# Patient Record
Sex: Male | Born: 1980 | Race: Black or African American | Hispanic: No | Marital: Single | Smoking: Current every day smoker
Health system: Southern US, Community
[De-identification: ages and names within clinical notes are randomized; demographics above are authoritative.]

---
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
## 2003-05-24 ENCOUNTER — Encounter: Payer: Self-pay | Admitting: Emergency Medicine

## 2003-05-24 ENCOUNTER — Emergency Department (HOSPITAL_COMMUNITY): Admission: EM | Admit: 2003-05-24 | Discharge: 2003-05-25 | Payer: Self-pay | Admitting: Emergency Medicine

## 2008-02-06 ENCOUNTER — Emergency Department (HOSPITAL_COMMUNITY): Admission: EM | Admit: 2008-02-06 | Discharge: 2008-02-06 | Payer: Self-pay | Admitting: Emergency Medicine

## 2011-07-07 ENCOUNTER — Other Ambulatory Visit (HOSPITAL_COMMUNITY): Payer: Self-pay | Admitting: Internal Medicine

## 2011-07-07 ENCOUNTER — Ambulatory Visit (HOSPITAL_COMMUNITY)
Admission: RE | Admit: 2011-07-07 | Discharge: 2011-07-07 | Disposition: A | Discharge status: Home / Self Care | Payer: Self-pay | Source: Ambulatory Visit | Attending: Internal Medicine | Admitting: Internal Medicine

## 2011-07-07 DIAGNOSIS — R109 Unspecified abdominal pain: Secondary | ICD-10-CM | POA: Insufficient documentation

## 2012-05-23 IMAGING — CR DG ABDOMEN 1V
1 series · 1 of 1 positions shown · non-contrast
Comparison: None.

CLINICAL DATA: Abdominal pain for 2 weeks

ABDOMEN - 1 VIEW

[t abdomen supine]
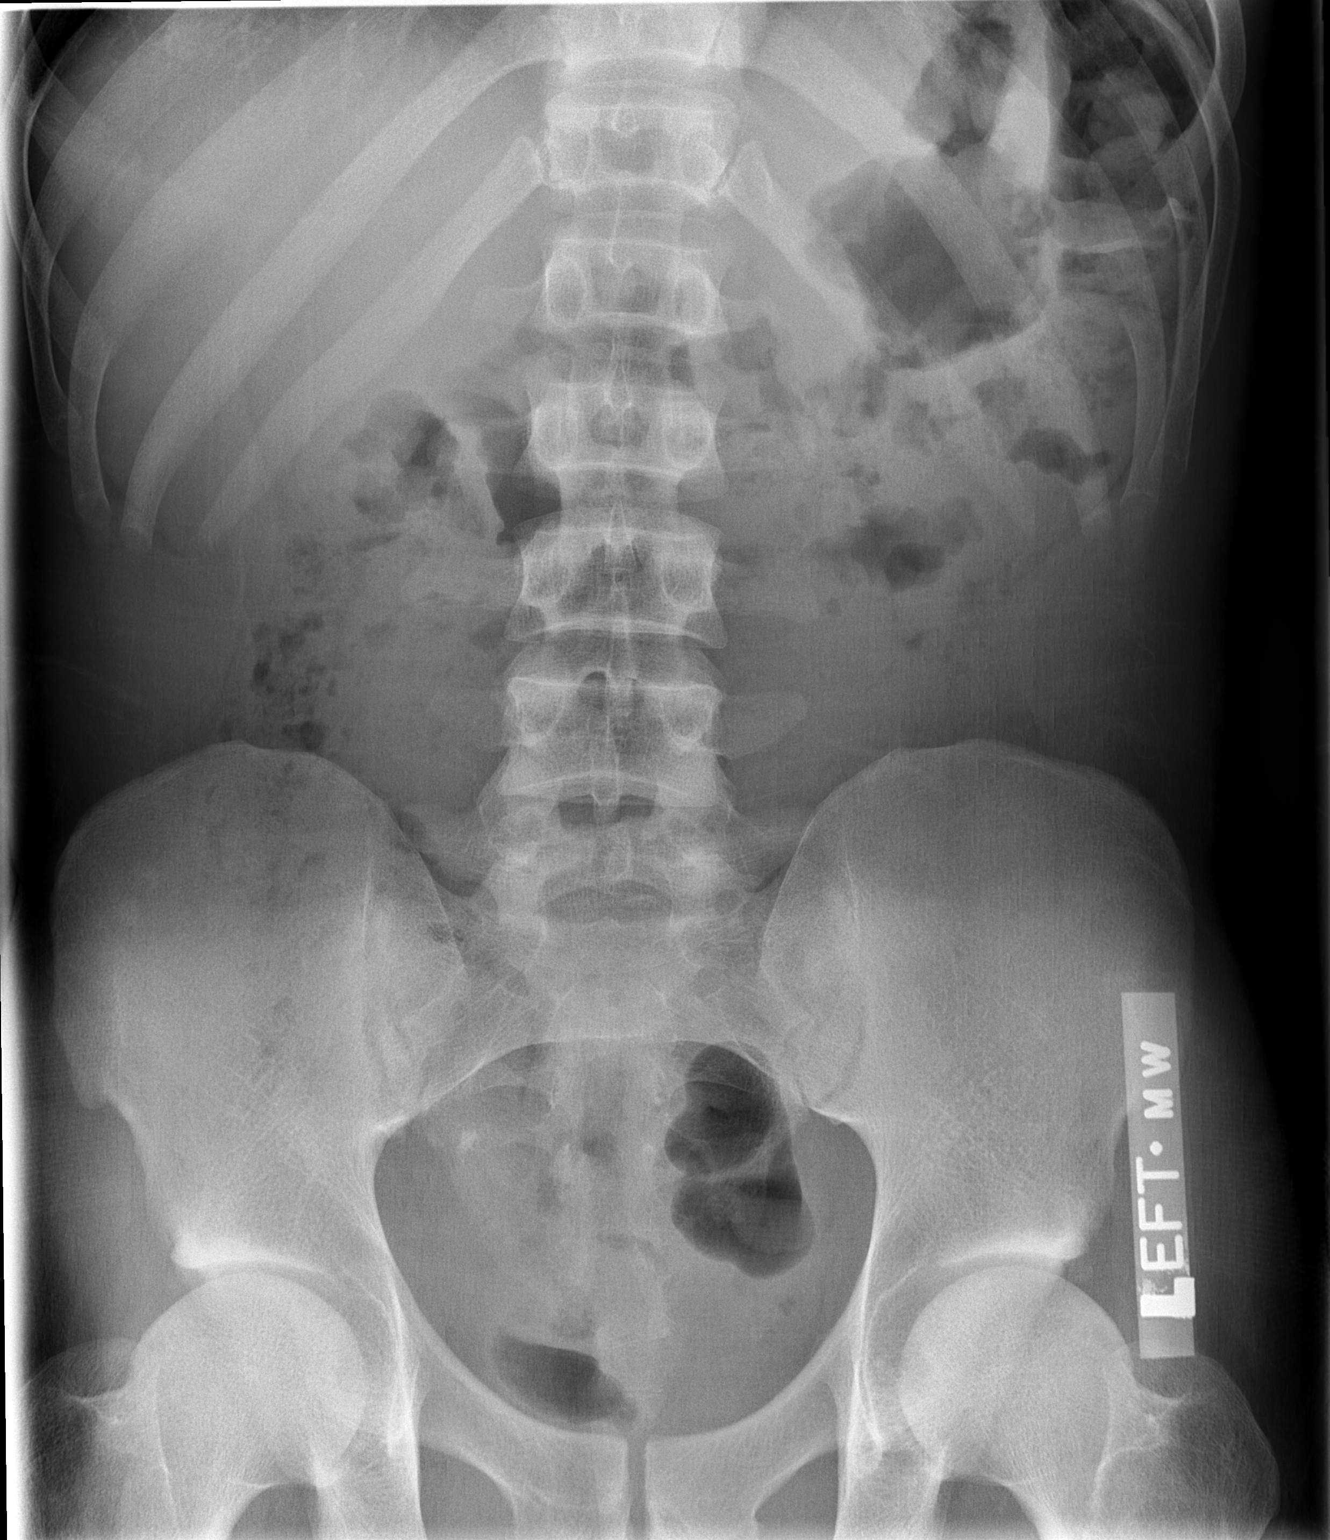

[1 of 1 positions shown; findings below may reference images not displayed]

FINDINGS: The bowel gas pattern is normal.  No significant stool
burden.  No abnormal calcification or evidence of organomegaly.
The bones are unremarkable.
IMPRESSION: Normal bowel gas pattern.

## 2014-02-01 ENCOUNTER — Emergency Department (HOSPITAL_COMMUNITY): Payer: Self-pay

## 2014-02-01 ENCOUNTER — Emergency Department (HOSPITAL_COMMUNITY)
Admission: EM | Admit: 2014-02-01 | Discharge: 2014-02-01 | Disposition: A | Discharge status: Home / Self Care | Payer: Self-pay | Attending: Emergency Medicine | Admitting: Emergency Medicine

## 2014-02-01 ENCOUNTER — Encounter (HOSPITAL_COMMUNITY): Payer: Self-pay | Admitting: Emergency Medicine

## 2014-02-01 DIAGNOSIS — F17200 Nicotine dependence, unspecified, uncomplicated: Secondary | ICD-10-CM | POA: Insufficient documentation

## 2014-02-01 DIAGNOSIS — N453 Epididymo-orchitis: Secondary | ICD-10-CM | POA: Insufficient documentation

## 2014-02-01 DIAGNOSIS — R110 Nausea: Secondary | ICD-10-CM | POA: Insufficient documentation

## 2014-02-01 DIAGNOSIS — N451 Epididymitis: Secondary | ICD-10-CM

## 2014-02-01 DIAGNOSIS — Z880 Allergy status to penicillin: Secondary | ICD-10-CM | POA: Insufficient documentation

## 2014-02-01 LAB — URINALYSIS, ROUTINE W REFLEX MICROSCOPIC
Bilirubin Urine: NEGATIVE
GLUCOSE, UA: NEGATIVE mg/dL
Ketones, ur: NEGATIVE mg/dL
Nitrite: NEGATIVE
PROTEIN: NEGATIVE mg/dL
Specific Gravity, Urine: 1.02 (ref 1.005–1.030)
UROBILINOGEN UA: 1 mg/dL (ref 0.0–1.0)
pH: 6.5 (ref 5.0–8.0)

## 2014-02-01 LAB — URINE MICROSCOPIC-ADD ON

## 2014-02-01 MED ORDER — CEFTRIAXONE SODIUM 250 MG IJ SOLR
250.0000 mg | INTRAMUSCULAR | Status: DC
  Administered 2014-02-01: 250 mg via INTRAMUSCULAR
  Filled 2014-02-01: qty 250

## 2014-02-01 MED ORDER — OXYCODONE-ACETAMINOPHEN 5-325 MG PO TABS: 1.0000 | ORAL_TABLET | Freq: Four times a day (QID) | ORAL | Status: DC | PRN

## 2014-02-01 MED ORDER — LIDOCAINE HCL 1 % IJ SOLN
0.9000 mL | Freq: Once | INTRAMUSCULAR | Status: AC
  Administered 2014-02-01: 0.9 mL

## 2014-02-01 MED ORDER — LIDOCAINE HCL 1 % IJ SOLN
INTRAMUSCULAR | Status: AC
  Filled 2014-02-01: qty 20

## 2014-02-01 MED ORDER — PROMETHAZINE HCL 25 MG PO TABS: 25.0000 mg | ORAL_TABLET | Freq: Four times a day (QID) | ORAL | Status: DC | PRN

## 2014-02-01 MED ORDER — AZITHROMYCIN 250 MG PO TABS
1000.0000 mg | ORAL_TABLET | Freq: Once | ORAL | Status: AC
  Administered 2014-02-01: 1000 mg via ORAL
  Filled 2014-02-01: qty 4

## 2014-02-01 MED ORDER — OXYCODONE-ACETAMINOPHEN 5-325 MG PO TABS
2.0000 | ORAL_TABLET | Freq: Once | ORAL | Status: AC
  Administered 2014-02-01: 2 via ORAL
  Filled 2014-02-01: qty 2

## 2014-02-01 MED ORDER — ONDANSETRON 8 MG PO TBDP
8.0000 mg | ORAL_TABLET | Freq: Once | ORAL | Status: AC
  Administered 2014-02-01: 8 mg via ORAL
  Filled 2014-02-01: qty 1

## 2014-02-01 MED ORDER — DOXYCYCLINE HYCLATE 100 MG PO CAPS: 100.0000 mg | ORAL_CAPSULE | Freq: Two times a day (BID) | ORAL | Status: DC

## 2014-02-01 NOTE — ED Notes (Signed)
Pt c/o lower right abd and nausea x 1 day.

## 2014-02-01 NOTE — Progress Notes (Signed)
[facility] provided pt with a list of primary care resources and a [facility] [employer] Card application to help patient establish primary care.  °

## 2014-02-01 NOTE — ED Notes (Signed)
Initial Contact - pt to [facility]17 with family, reports c/o 7/10 RLQ abd pain x2 days, also mild nausea.  Pt also reports pain occasionally in R testicle, denies redness or swelling to site.  Pt reports pain worse with movement.  Denies dysuria, fevers/chills, or other complaints.  Skin PWD.  A+Ox4.  Ambulatory with steady gait.  Speaking full/clear sentences.  NAD.

## 2014-02-01 NOTE — ED Notes (Signed)
Pt ret from U/S at this time, family remains at bedside.  Pt denies needs/complaints currently.  NAD.

## 2014-02-01 NOTE — Discharge Instructions (Signed)
Epididymitis  Epididymitis is a swelling (inflammation) of the epididymis. The epididymis is a cord-like structure along the back part of the testicle. Epididymitis is usually, but not always, caused by infection. This is usually a sudden problem beginning with chills, fever and pain behind the scrotum and in the testicle. There may be swelling and redness of the testicle.  DIAGNOSIS   Physical examination will reveal a tender, swollen epididymis. Sometimes, cultures are obtained from the urine or from prostate secretions to help find out if there is an infection or if the cause is a different problem. Sometimes, blood work is performed to see if your white blood cell count is elevated and if a germ (bacterial) or viral infection is present. Using this knowledge, an appropriate medicine which kills germs (antibiotic) can be chosen by your caregiver. A viral infection causing epididymitis will most often go away (resolve) without treatment.  HOME CARE INSTRUCTIONS   · Hot sitz baths for 20 minutes, 4 times per day, may help relieve pain.  · Only take over-the-counter or prescription medicines for pain, discomfort or fever as directed by your caregiver.  · Take all medicines, including antibiotics, as directed. Take the antibiotics for the full prescribed length of time even if you are feeling better.  · It is very important to keep all follow-up appointments.  SEEK IMMEDIATE MEDICAL CARE IF:   · You have a fever.  · You have pain not relieved with medicines.  · You have any worsening of your problems.  · Your pain seems to come and go.  · You develop pain, redness, and swelling in the scrotum and surrounding areas.  MAKE SURE YOU:   · Understand these instructions.  · Will watch your condition.  · Will get help right away if you are not doing well or get worse.  Document Released: 09/04/2000 Document Revised: 11/30/2011 Document Reviewed: 07/25/2009  Exit[employer]® Patient Information ©2014 [employer].

## 2014-02-01 NOTE — ED Notes (Signed)
Pt to US at this time.

## 2014-02-01 NOTE — ED Provider Notes (Signed)
CSN: [id number]     Arrival date & time 02/01/14  1057 History   First MD Initiated Contact with Patient 02/01/14 1140     Chief Complaint  Patient presents with  . Abdominal Pain  . Nausea     (Consider location/radiation/quality/duration/timing/severity/associated sxs/prior Treatment) HPI Comments: Patient is a 33 year old male who presents to the emergency department today with right lower quadrant pain. He states the pain has been gradually worsening since yesterday. The pain does not radiate. He has associated nausea without vomiting. No diarrhea. He admits to associated right testicular pain and swelling. He denies any fever, chills, penile pain, penile discharge.  The history is provided by the patient. No language interpreter was used.    History reviewed. No pertinent past medical history. History reviewed. No pertinent past surgical history. No family history on file. History  Substance Use Topics  . Smoking status: Current Every Day Smoker  . Smokeless tobacco: Not on file  . Alcohol Use: Not on file    Review of Systems  Constitutional: Negative for fever and chills.  Respiratory: Negative for shortness of breath.   Cardiovascular: Negative for chest pain.  Gastrointestinal: Positive for nausea and abdominal pain. Negative for vomiting.  Genitourinary: Positive for scrotal swelling and testicular pain. Negative for penile swelling and penile pain.  All other systems reviewed and are negative.     Allergies  Penicillins  Home Medications   Prior to Admission medications   Not on File   BP 144/93  Pulse 72  Temp(Src) 98.7 F (37.1 C) (Oral)  Resp 18  SpO2 100% Physical Exam  Nursing note and vitals reviewed. Constitutional: He is oriented to person, place, and time. He appears well-developed and well-nourished. No distress.  HENT:  Head: Normocephalic and atraumatic.  Right Ear: External ear normal.  Left Ear: External ear normal.  Nose: Nose  normal.  Eyes: Conjunctivae are normal.  Neck: Normal range of motion. No tracheal deviation present.  Cardiovascular: Normal rate, regular rhythm and normal heart sounds.   Pulmonary/Chest: Effort normal and breath sounds normal. No stridor.  Abdominal: Soft. He exhibits no distension. There is tenderness in the right lower quadrant.    Genitourinary: Right testis shows swelling and tenderness. Cremasteric reflex is absent on the right side. Left testis shows no swelling and no tenderness. Cremasteric reflex is absent on the left side. No penile tenderness.  Musculoskeletal: Normal range of motion.  Neurological: He is alert and oriented to person, place, and time.  Skin: Skin is warm and dry. He is not diaphoretic.  Psychiatric: He has a normal mood and affect. His behavior is normal.    ED Course  Procedures (including critical care time) Labs Review Labs Reviewed  URINALYSIS, ROUTINE W REFLEX MICROSCOPIC - Abnormal; Notable for the following:    APPearance TURBID (*)    Hgb urine dipstick SMALL (*)    Leukocytes, UA LARGE (*)    All other components within normal limits  URINE MICROSCOPIC-ADD ON - Abnormal; Notable for the following:    Bacteria, UA FEW (*)    All other components within normal limits  URINE CULTURE    Imaging Review [place] Scrotum  02/01/2014   CLINICAL DATA:  RT TESTICULAR PAIN R/O TORSION  EXAM: SCROTAL ULTRASOUND  DOPPLER ULTRASOUND OF THE TESTICLES  TECHNIQUE: Complete ultrasound examination of the testicles, epididymis, and other scrotal structures was performed. Color and spectral Doppler ultrasound were also utilized to evaluate blood flow to the testicles.  COMPARISON:  None.  FINDINGS: Right testicle  Measurements: 3.7 x 2.2 x 2.9 cm. No mass or microlithiasis visualized. Diffuse hyperemia.  Left testicle  Measurements: 3.3 x 1.7 x 2.3 cm. No mass or microlithiasis visualized.  Right epididymis:  Normal in size with diffuse hyperemia.  Left epididymis:   Normal in size and appearance.  Hydrocele:  Moderate hydrocele on the right.  Varicocele:  Bilateral.  Pulsed Doppler interrogation of both testes demonstrates low resistance arterial and venous waveforms bilaterally.  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   Electronically Signed   By: Salome HolmesHector  Cooper M.D.   On: 02/01/2014 12:46   [place] Art/ven Flow Abd Pelv Doppler  02/01/2014   CLINICAL DATA:  RT TESTICULAR PAIN R/O TORSION  EXAM: SCROTAL ULTRASOUND  DOPPLER ULTRASOUND OF THE TESTICLES  TECHNIQUE: Complete ultrasound examination of the testicles, epididymis, and other scrotal structures was performed. Color and spectral Doppler ultrasound were also utilized to evaluate blood flow to the testicles.  COMPARISON:  None.  FINDINGS: Right testicle  Measurements: 3.7 x 2.2 x 2.9 cm. No mass or microlithiasis visualized. Diffuse hyperemia.  Left testicle  Measurements: 3.3 x 1.7 x 2.3 cm. No mass or microlithiasis visualized.  Right epididymis:  Normal in size with diffuse hyperemia.  Left epididymis:  Normal in size and appearance.  Hydrocele:  Moderate hydrocele on the right.  Varicocele:  Bilateral.  Pulsed Doppler interrogation of both testes demonstrates low resistance arterial and venous waveforms bilaterally.  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   Electronically Signed   By: Salome HolmesHector  Cooper M.D.   On: 02/01/2014 12:46     EKG Interpretation None      MDM   Final diagnoses:  Epididymitis, right    Patient presents to the emergency department for evaluation of right testicle pain and swelling. US of scrotum shows orchiepididymitis with moderate hydrocele. Patient given azithromycin and rocephin in ED. Rx for doxycycline for home. Patient given strict return instructions. F/u with PCP. Vital signs stable for discharge. Dr. Juleen ChinaKohut evaluated patient and agrees with plan. Patient / Family / Caregiver informed of clinical course, understand  medical decision-making process, and agree with plan.   Mora BellmanHannah S Bentlie Catanzaro, PA-C 02/03/14 [phone]

## 2014-02-02 LAB — URINE CULTURE
Colony Count: NO GROWTH
Culture: NO GROWTH

## 2014-02-05 NOTE — ED Provider Notes (Signed)
Medical screening examination/treatment/procedure(s) were performed by non-physician practitioner and as supervising physician I was immediately available for consultation/collaboration.   EKG Interpretation None       Batsheva Stevick, MD 02/05/14 0848 

## 2014-12-19 IMAGING — US US SCROTUM
1 series · 14 of 25 positions shown · non-contrast
Comparison: None.

CLINICAL DATA: RT TESTICULAR PAIN R/O TORSION

EXAM:
SCROTAL ULTRASOUND
DOPPLER ULTRASOUND OF THE TESTICLES
TECHNIQUE: Complete ultrasound examination of the testicles, epididymis, and
other scrotal structures was performed. Color and spectral Doppler
ultrasound were also utilized to evaluate blood flow to the
testicles.

[Series 1: us scrotum · 0.06mm/px · 14 of 63 slices shown]
[im 1/63]
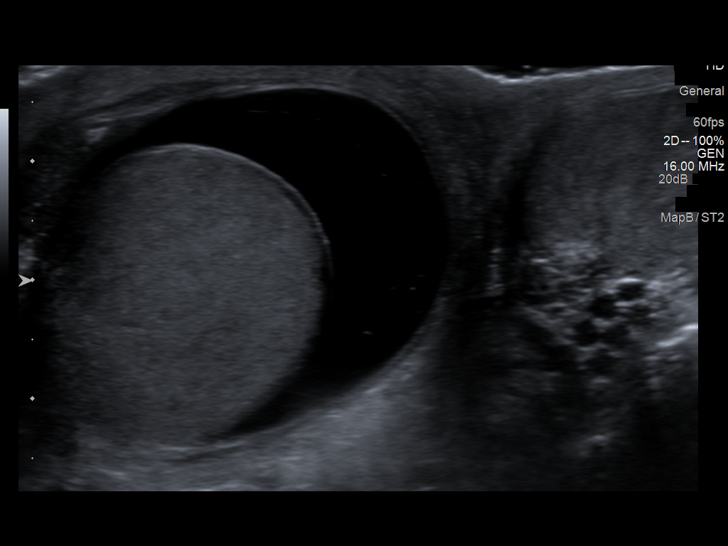
[im 6/63]
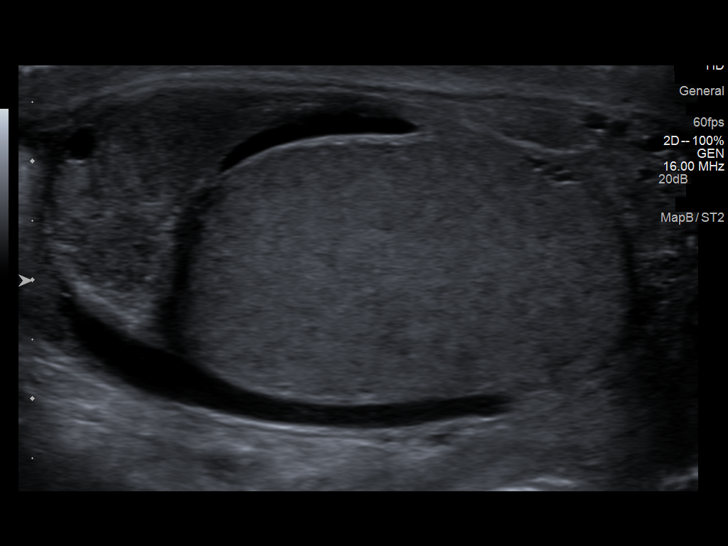
[im 11/63]
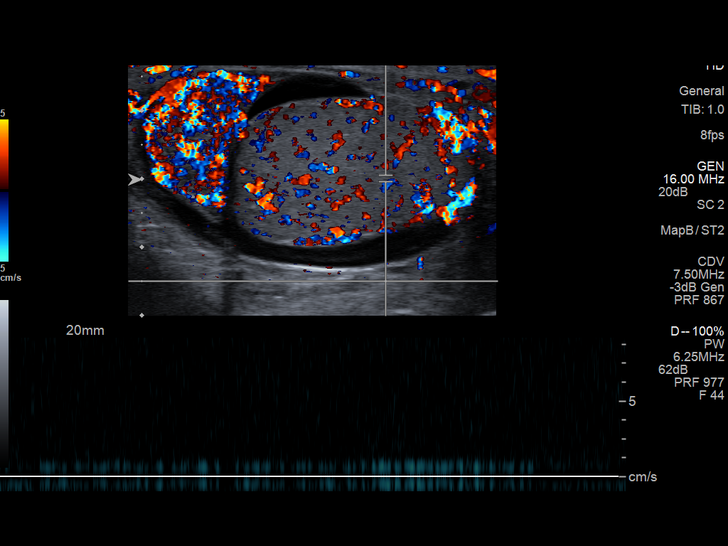
[im 16/63]
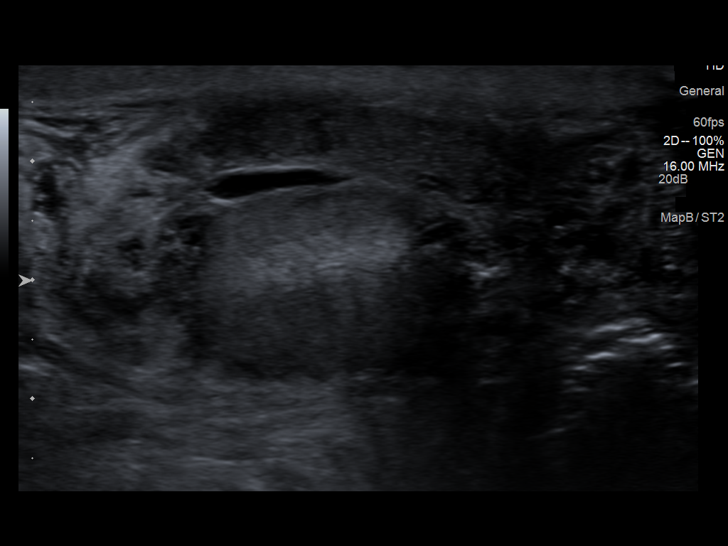
[im 21/63]
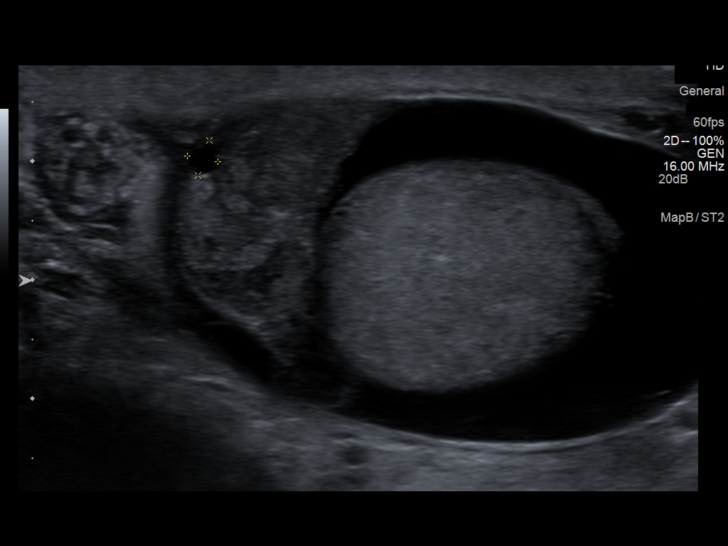
[im 24/63]
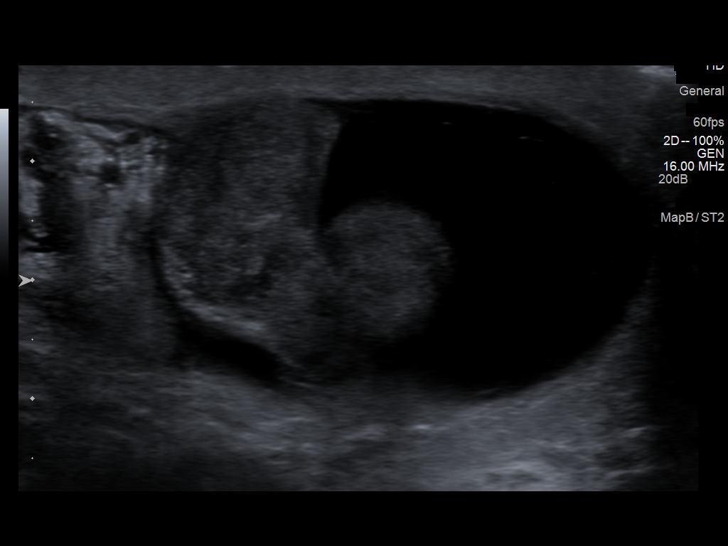
[im 29/63]
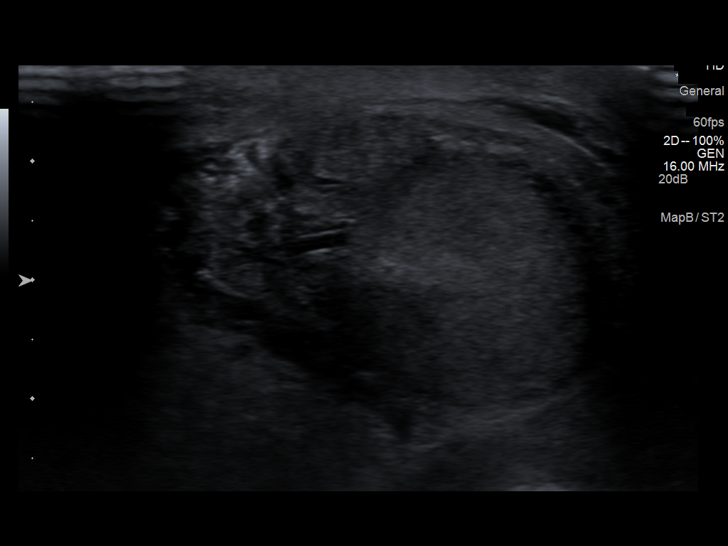
[im 34/63]
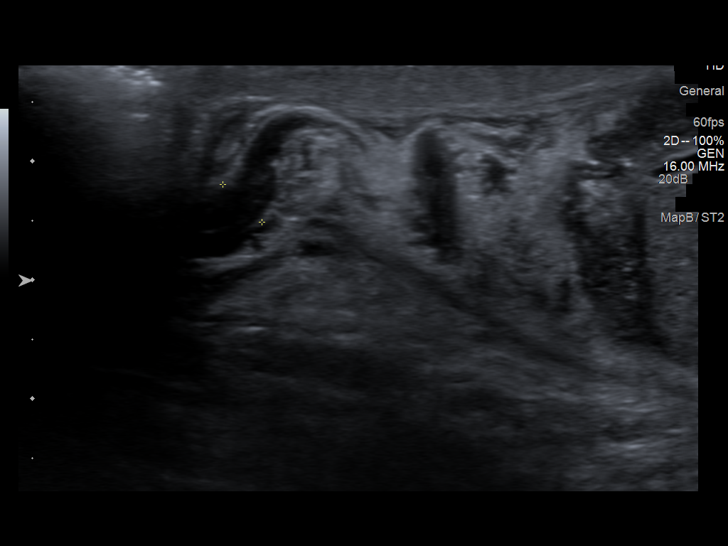
[im 39/63]
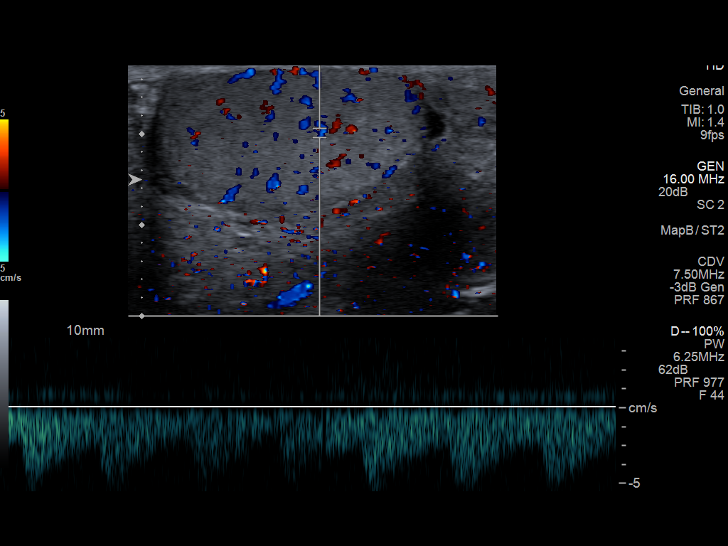
[im 42/63]
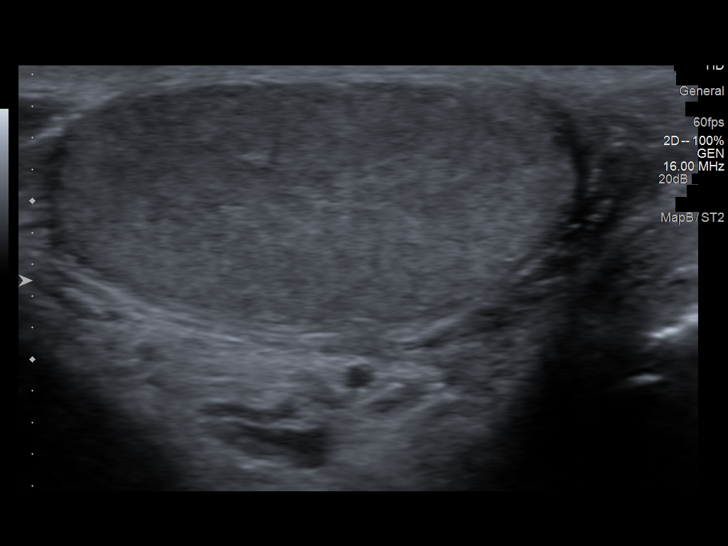
[im 47/63]
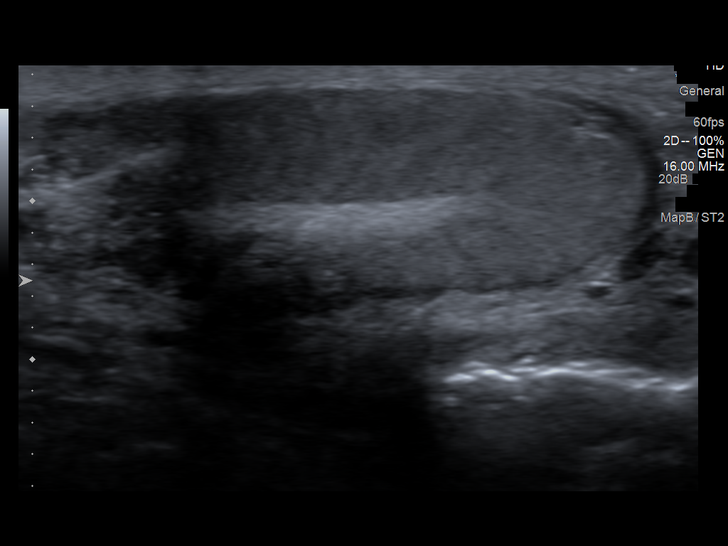
[im 52/63]
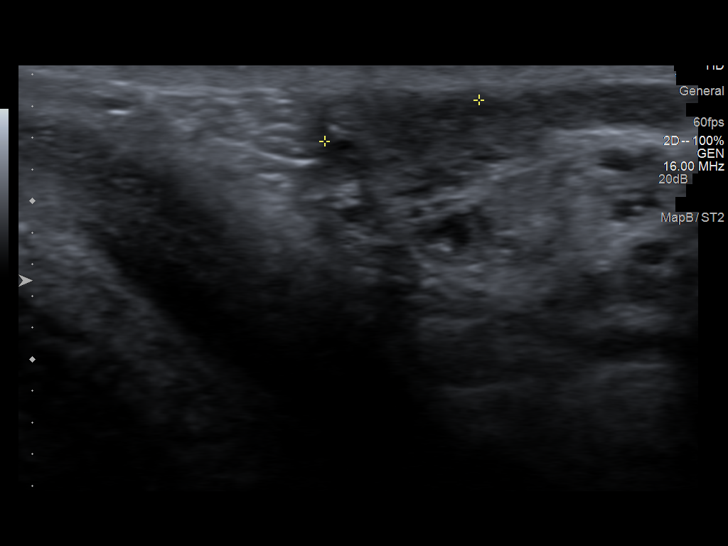
[im 57/63]
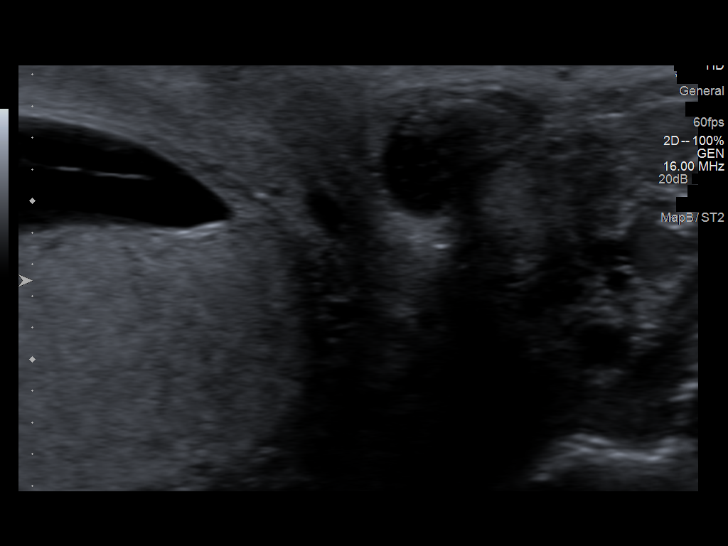
[im 63/63]
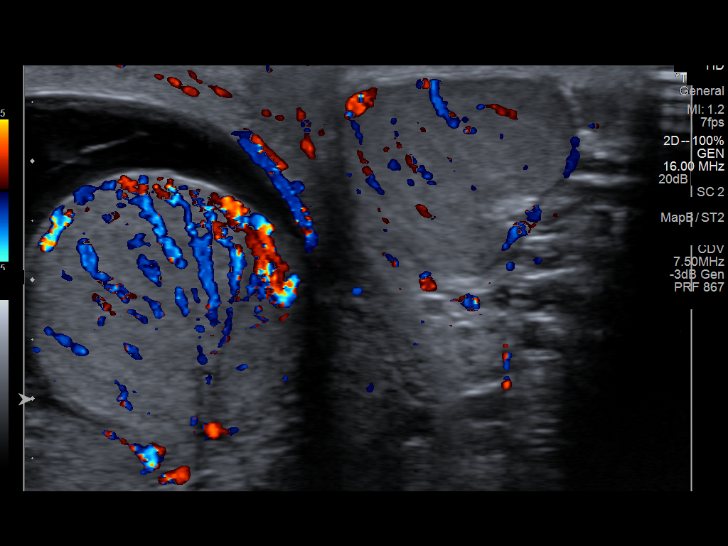

[14 of 25 positions shown; findings below may reference images not displayed]

FINDINGS: Right testicle

Measurements: 3.7 x 2.2 x 2.9 cm. No mass or microlithiasis
visualized. Diffuse hyperemia.

Left testicle

Measurements: 3.3 x 1.7 x 2.3 cm. No mass or microlithiasis
visualized.

Right epididymis:  Normal in size with diffuse hyperemia.

Left epididymis:  Normal in size and appearance.

Hydrocele:  Moderate hydrocele on the right.

Varicocele:  Bilateral.

Pulsed Doppler interrogation of both testes demonstrates low
resistance arterial and venous waveforms bilaterally.
IMPRESSION: Orchiepididymitis on the right with a moderate hydrocele.

## 2022-07-11 ENCOUNTER — Other Ambulatory Visit: Payer: Self-pay

## 2022-07-11 ENCOUNTER — Emergency Department (HOSPITAL_COMMUNITY)
Admission: EM | Admit: 2022-07-11 | Discharge: 2022-07-11 | Disposition: A | Discharge status: Home / Self Care | Payer: No Typology Code available for payment source | Attending: Emergency Medicine | Admitting: Emergency Medicine

## 2022-07-11 ENCOUNTER — Encounter (HOSPITAL_COMMUNITY): Payer: Self-pay

## 2022-07-11 DIAGNOSIS — B9789 Other viral agents as the cause of diseases classified elsewhere: Secondary | ICD-10-CM | POA: Insufficient documentation

## 2022-07-11 DIAGNOSIS — R509 Fever, unspecified: Secondary | ICD-10-CM | POA: Diagnosis present

## 2022-07-11 DIAGNOSIS — Z20822 Contact with and (suspected) exposure to covid-19: Secondary | ICD-10-CM | POA: Insufficient documentation

## 2022-07-11 DIAGNOSIS — J069 Acute upper respiratory infection, unspecified: Secondary | ICD-10-CM | POA: Diagnosis not present

## 2022-07-11 LAB — RESP PANEL BY RT-PCR (FLU A&B, COVID) ARPGX2
Influenza A by PCR: NEGATIVE
Influenza B by PCR: NEGATIVE
SARS Coronavirus 2 by RT PCR: NEGATIVE

## 2022-07-11 NOTE — ED Provider Notes (Signed)
[facility] [place] DEPT Provider Note   CSN: [id number] Arrival date & time: 07/11/22  6789     History  Chief Complaint  Patient presents with   Fever   Headache   Emesis    Jeremy Pham is a 41 y.o. male.  Patient here with fever, body aches for the last several days.  Took Tylenol last night.  No fever this morning.  He needs a work note.  He denies any nausea, vomiting, diarrhea.  He is feeling little bit better today but still with some congestion.  No ear or throat pain.  No significant medical problems.  The history is provided by the patient.       Home Medications Prior to Admission medications   Medication Sig Start Date End Date Taking? Authorizing Provider  doxycycline (VIBRAMYCIN) 100 MG capsule Take 1 capsule (100 mg total) by mouth 2 (two) times daily. 02/01/14   Cleatrice Burke, PA-C  oxyCODONE-acetaminophen (PERCOCET/ROXICET) 5-325 MG per tablet Take 1-2 tablets by mouth every 6 (six) hours as needed for severe pain. 02/01/14   Cleatrice Burke, PA-C  promethazine (PHENERGAN) 25 MG tablet Take 1 tablet (25 mg total) by mouth every 6 (six) hours as needed for nausea or vomiting. 02/01/14   Cleatrice Burke, PA-C      Allergies    Penicillins    Review of Systems   Review of Systems  Physical Exam Updated Vital Signs BP (!) 128/109 (BP Location: Right Arm)   Pulse 68   Temp 98.3 F (36.8 C) (Oral)   Resp 14   Ht 5\' 8"  (1.727 m)   Wt 74.8 kg   SpO2 100%   BMI 25.09 kg/m  Physical Exam Vitals and nursing note reviewed.  Constitutional:      General: He is not in acute distress.    Appearance: He is well-developed.  HENT:     Head: Normocephalic and atraumatic.  Eyes:     Extraocular Movements: Extraocular movements intact.     Conjunctiva/sclera: Conjunctivae normal.  Cardiovascular:     Rate and Rhythm: Normal rate and regular rhythm.     Heart sounds: Normal heart sounds. No murmur heard. Pulmonary:     Effort:  Pulmonary effort is normal. No respiratory distress.     Breath sounds: Normal breath sounds.  Abdominal:     Palpations: Abdomen is soft.     Tenderness: There is no abdominal tenderness.  Musculoskeletal:        General: No swelling.     Cervical back: Normal range of motion and neck supple.  Skin:    General: Skin is warm and dry.     Capillary Refill: Capillary refill takes less than 2 seconds.  Neurological:     Mental Status: He is alert.  Psychiatric:        Mood and Affect: Mood normal.     ED Results / Procedures / Treatments   Labs (all labs ordered are listed, but only abnormal results are displayed) Labs Reviewed  RESP PANEL BY RT-PCR (FLU A&B, COVID) [id number]    EKG None  Radiology No results found.  Procedures Procedures    Medications Ordered in ED Medications - No data to display  ED Course/ Medical Decision Making/ A&P                           Medical Decision Making  Jeremy Pham is here with URI symptoms.  Very  well-appearing.  Normal vitals.  No significant comorbidities.  Will check for COVID.  Overall suspect viral process.  No signs of ear or throat infection on exam.  Patient needs work note for the last several days.  We will provide this.  Recommend continued supportive care at home with Tylenol, ibuprofen, fluids.  He does not look dehydrated.  He took Tylenol last night.  He has no fever this morning.  Suspect that he is improving.  Discharged in good condition.  This chart was dictated using voice recognition software.  Despite best efforts to proofread,  errors can occur which can change the documentation meaning.         Final Clinical Impression(s) / ED Diagnoses Final diagnoses:  Viral upper respiratory tract infection    Rx / DC Orders ED Discharge Orders     None         Lennice Sites, DO 07/11/22 1009

## 2022-07-11 NOTE — Discharge Instructions (Signed)
Follow-up COVID testing your MyChart.

## 2022-07-11 NOTE — ED Triage Notes (Signed)
Patient c/o fever, headache, and vomiting that started yesterday. Patient states he has been taking OTC flu and fever medications.

## 2022-09-16 ENCOUNTER — Encounter (HOSPITAL_COMMUNITY): Payer: Self-pay | Admitting: Emergency Medicine

## 2022-09-16 ENCOUNTER — Emergency Department (HOSPITAL_COMMUNITY)
Admission: EM | Admit: 2022-09-16 | Discharge: 2022-09-17 | Discharge status: Against Medical Advice | Payer: No Typology Code available for payment source | Attending: Emergency Medicine | Admitting: Emergency Medicine

## 2022-09-16 ENCOUNTER — Other Ambulatory Visit: Payer: Self-pay

## 2022-09-16 DIAGNOSIS — Z1152 Encounter for screening for COVID-19: Secondary | ICD-10-CM | POA: Insufficient documentation

## 2022-09-16 DIAGNOSIS — Z5329 Procedure and treatment not carried out because of patient's decision for other reasons: Secondary | ICD-10-CM | POA: Diagnosis not present

## 2022-09-16 DIAGNOSIS — J069 Acute upper respiratory infection, unspecified: Secondary | ICD-10-CM | POA: Insufficient documentation

## 2022-09-16 DIAGNOSIS — R059 Cough, unspecified: Secondary | ICD-10-CM | POA: Diagnosis present

## 2022-09-16 LAB — RESP PANEL BY RT-PCR (RSV, FLU A&B, COVID)  RVPGX2
Influenza A by PCR: NEGATIVE
Influenza B by PCR: NEGATIVE
Resp Syncytial Virus by PCR: NEGATIVE
SARS Coronavirus 2 by RT PCR: NEGATIVE

## 2022-09-16 NOTE — ED Provider Triage Note (Signed)
Emergency Medicine Provider Triage Evaluation Note  Jeremy Pham , a 41 y.o. male  was evaluated in triage.  Pt complains of URI symptoms since the 23rd.  States he took a at home COVID and flu test which was negative.  He is looking for a respiratory panel and a work note..  Review of Systems  Positive: As above Negative: As above  Physical Exam  BP (!) 122/103 (BP Location: Right Arm)   Pulse 65   Temp 98.5 F (36.9 C) (Oral)   Resp 16   Ht 5\' 8"  (1.727 m)   Wt 74.8 kg   SpO2 100%   BMI 25.09 kg/m  Gen:   Awake, no distress   Resp:  Normal effort  MSK:   Moves extremities without difficulty  Other:   Medical Decision Making  Medically screening exam initiated at 6:14 PM.  Appropriate orders placed.  KUSHAL SAUNDERS was informed that the remainder of the evaluation will be completed by another provider, this initial triage assessment does not replace that evaluation, and the importance of remaining in the ED until their evaluation is complete.     Gardiner Barefoot, PA-C 09/16/22 1815

## 2022-09-16 NOTE — ED Triage Notes (Signed)
Pt via POV c/o fever, body aches, chest congestion, and headache since Dec 23. Pt had recent sick contacts at home and has traveled recently to [place]. Unable to verify max temp.

## 2022-09-17 ENCOUNTER — Emergency Department (HOSPITAL_BASED_OUTPATIENT_CLINIC_OR_DEPARTMENT_OTHER)
Admission: EM | Admit: 2022-09-17 | Discharge: 2022-09-17 | Disposition: A | Discharge status: Home / Self Care | Payer: No Typology Code available for payment source | Source: Home / Self Care | Attending: Emergency Medicine | Admitting: Emergency Medicine

## 2022-09-17 ENCOUNTER — Emergency Department (HOSPITAL_BASED_OUTPATIENT_CLINIC_OR_DEPARTMENT_OTHER): Payer: No Typology Code available for payment source

## 2022-09-17 DIAGNOSIS — Z7951 Long term (current) use of inhaled steroids: Secondary | ICD-10-CM | POA: Insufficient documentation

## 2022-09-17 DIAGNOSIS — F17210 Nicotine dependence, cigarettes, uncomplicated: Secondary | ICD-10-CM | POA: Insufficient documentation

## 2022-09-17 DIAGNOSIS — J069 Acute upper respiratory infection, unspecified: Secondary | ICD-10-CM | POA: Insufficient documentation

## 2022-09-17 DIAGNOSIS — J45909 Unspecified asthma, uncomplicated: Secondary | ICD-10-CM | POA: Insufficient documentation

## 2022-09-17 NOTE — ED Triage Notes (Signed)
Was at [facility] yesterday with body aches, congestion, headache. C/o cough & chest congestion. Negative viral swab yesterday. LWBS.

## 2022-09-17 NOTE — ED Provider Notes (Signed)
[facility] EMERGENCY DEPARTMENT Provider Note   CSN: [id number] Arrival date & time: 09/17/22  1258     History  Chief Complaint  Patient presents with   URI    Jeremy Pham is a 41 y.o. male.   URI   41 year old male presents emergency department with complaints of cough, congestion, headache, myalgias/arthralgias since 09/15/2022.  States he was visiting family up in [place] with onset of symptoms a day after Christmas.  Has been taking at home inhaler, over-the-counter cough and cold medicine but has helped significantly.  He states he feels "a lot better today."  States that the only symptom remaining now is mild cough.  Which has been relieved at home with at home inhaler.  Presents emergency department for results of respiratory viral swab as well as requesting work note for days missed.  Denies fever, chills, night sweats, chest pain, shortness of breath, abdominal pain or nausea, vomiting, urinary symptoms, change in bowel habits.  Past medical history significant for asthma.  Home Medications Prior to Admission medications   Medication Sig Start Date End Date Taking? Authorizing Provider  doxycycline (VIBRAMYCIN) 100 MG capsule Take 1 capsule (100 mg total) by mouth 2 (two) times daily. 02/01/14   Junious Silk, PA-C  oxyCODONE-acetaminophen (PERCOCET/ROXICET) 5-325 MG per tablet Take 1-2 tablets by mouth every 6 (six) hours as needed for severe pain. 02/01/14   Junious Silk, PA-C  promethazine (PHENERGAN) 25 MG tablet Take 1 tablet (25 mg total) by mouth every 6 (six) hours as needed for nausea or vomiting. 02/01/14   Junious Silk, PA-C      Allergies    Penicillins    Review of Systems   Review of Systems  All other systems reviewed and are negative.   Physical Exam Updated Vital Signs BP (!) 138/96 (BP Location: Right Arm)   Pulse 74   Temp 98.3 F (36.8 C) (Oral)   Resp 18   Ht 5\' 8"  (1.727 m)   Wt 74.8 kg   SpO2 100%   BMI 25.09  kg/m  Physical Exam Vitals and nursing note reviewed.  Constitutional:      General: He is not in acute distress.    Appearance: He is well-developed.  HENT:     Head: Normocephalic and atraumatic.     Nose: No congestion or rhinorrhea.     Mouth/Throat:     Pharynx: No oropharyngeal exudate or posterior oropharyngeal erythema.  Eyes:     Conjunctiva/sclera: Conjunctivae normal.  Cardiovascular:     Rate and Rhythm: Normal rate and regular rhythm.     Heart sounds: No murmur heard. Pulmonary:     Effort: Pulmonary effort is normal. No respiratory distress.     Breath sounds: Normal breath sounds. No stridor. No wheezing, rhonchi or rales.  Abdominal:     Palpations: Abdomen is soft.     Tenderness: There is no abdominal tenderness.  Musculoskeletal:        General: No swelling.     Cervical back: Neck supple.  Skin:    General: Skin is warm and dry.     Capillary Refill: Capillary refill takes less than 2 seconds.  Neurological:     Mental Status: He is alert.  Psychiatric:        Mood and Affect: Mood normal.     ED Results / Procedures / Treatments   Labs (all labs ordered are listed, but only abnormal results are displayed) Labs Reviewed - No data  to display  EKG None  Radiology DG Chest 2 View  Result Date: 09/17/2022 CLINICAL DATA:  Cough, shortness of breath. EXAM: CHEST - 2 VIEW COMPARISON:  None Available. FINDINGS: The heart size and mediastinal contours are within normal limits. Both lungs are clear. Hyperexpansion of the lungs is noted. The visualized skeletal structures are unremarkable. IMPRESSION: No active cardiopulmonary disease. Electronically Signed   By: Lupita Raider M.D.   On: 09/17/2022 14:21    Procedures Procedures    Medications Ordered in ED Medications - No data to display  ED Course/ Medical Decision Making/ A&P                           Medical Decision Making Amount and/or Complexity of Data Reviewed Radiology:  ordered.  This patient presents to the ED for concern of influenza type illness, this involves an extensive number of treatment options, and is a complaint that carries with it a high risk of complications and morbidity.  The differential diagnosis includes influenza, COVID, RSV, pneumonia, meningitis, sepsis   Co morbidities that complicate the patient evaluation  See HPI   Additional history obtained:  Additional history obtained from EMR External records from outside source obtained and reviewed including hospital records   Lab Tests:    : Respiratory viral panel negative yesterday for COVID, influenza, RSV  Imaging Studies ordered:  I ordered imaging studies including chest x-ray I independently visualized and interpreted imaging which showed no acute cardiopulmonary abnormality I agree with the radiologist interpretation   Cardiac Monitoring: / EKG:  The patient was maintained on a cardiac monitor.  I personally viewed and interpreted the cardiac monitored which showed an underlying rhythm of: Sinus rhythm   Consultations Obtained:  N/a   Problem List / ED Course / Critical interventions / Medication management  Viral URI with cough Reevaluation of the patient  showed that the patient stayed the same I have reviewed the patients home medicines and have made adjustments as needed   Social Determinants of Health:  Reports cigarette use.  Denies illicit drug use.   Test / Admission - Considered:  Viral URI with cough Vitals signs  within normal range and stable throughout visit. Laboratory/imaging studies significant for: See above Patient symptoms likely secondary to viral URI.  Patient tested negative for COVID, influenza, RSV.  Chest x-ray negative for any infiltrates/consolidations.  Patient not hypoxic in no acute respiratory distress and afebrile in nature.  Patient seems to be most concerned about work note which was provided.  Recommend reevaluation  by another.  Treatment plan discussed length with patient and he acknowledged understanding was agreeable to said plan. Worrisome signs and symptoms were discussed with the patient, and the patient acknowledged understanding to return to the ED if noticed. Patient was stable upon discharge.          Final Clinical Impression(s) / ED Diagnoses Final diagnoses:  Viral URI with cough    Rx / DC Orders ED Discharge Orders     None         Peter Garter, [place] 09/17/22 1636    Rolan Bucco, MD 09/17/22 2308

## 2022-09-17 NOTE — Discharge Instructions (Addendum)
Note the workup today was overall reassuring.  Respiratory viral panel was negative for influenza, COVID, RSV.  Chest x-ray was clear of any pneumonia.  Recommend continue symptomatic treatment at home as you have been doing.  Work note attached your discharge papers.  Please do not hesitate to return to emergency department if the worrisome signs and symptoms we discussed become apparent.

## 2023-12-14 ENCOUNTER — Other Ambulatory Visit: Payer: Self-pay

## 2023-12-14 ENCOUNTER — Emergency Department (HOSPITAL_COMMUNITY)

## 2023-12-14 ENCOUNTER — Emergency Department (HOSPITAL_COMMUNITY)
Admission: EM | Admit: 2023-12-14 | Discharge: 2023-12-15 | Discharge status: Against Medical Advice | Attending: Emergency Medicine | Admitting: Emergency Medicine

## 2023-12-14 DIAGNOSIS — Z5321 Procedure and treatment not carried out due to patient leaving prior to being seen by health care provider: Secondary | ICD-10-CM | POA: Diagnosis not present

## 2023-12-14 DIAGNOSIS — R079 Chest pain, unspecified: Secondary | ICD-10-CM | POA: Insufficient documentation

## 2023-12-14 DIAGNOSIS — R0602 Shortness of breath: Secondary | ICD-10-CM | POA: Insufficient documentation

## 2023-12-14 DIAGNOSIS — R062 Wheezing: Secondary | ICD-10-CM | POA: Diagnosis not present

## 2023-12-14 LAB — CBC
HCT: 38.9 % — ABNORMAL LOW (ref 39.0–52.0)
Hemoglobin: 13.3 g/dL (ref 13.0–17.0)
MCH: 31.4 pg (ref 26.0–34.0)
MCHC: 34.2 g/dL (ref 30.0–36.0)
MCV: 92 fL (ref 80.0–100.0)
Platelets: 265 10*3/uL (ref 150–400)
RBC: 4.23 MIL/uL (ref 4.22–5.81)
RDW: 12.7 % (ref 11.5–15.5)
WBC: 6.1 10*3/uL (ref 4.0–10.5)
nRBC: 0 % (ref 0.0–0.2)

## 2023-12-14 LAB — TROPONIN I (HIGH SENSITIVITY): Troponin I (High Sensitivity): 4 ng/L (ref <18)

## 2023-12-14 LAB — BASIC METABOLIC PANEL
Anion gap: 8 (ref 5–15)
BUN: 9 mg/dL (ref 6–20)
CO2: 26 mmol/L (ref 22–32)
Calcium: 9.3 mg/dL (ref 8.9–10.3)
Chloride: 104 mmol/L (ref 98–111)
Creatinine, Ser: 1.18 mg/dL (ref 0.61–1.24)
GFR, Estimated: >60 mL/min (ref >60)
Glucose, Bld: 88 mg/dL (ref 70–99)
Potassium: 3.6 mmol/L (ref 3.5–5.1)
Sodium: 138 mmol/L (ref 135–145)

## 2023-12-14 NOTE — ED Triage Notes (Signed)
 Pt mentions that he has been dealing with shortness of breath for a few years but today while he was working he felt like he had an exacerbation of the shortness of breath that was accompanied with chest pain. 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 No Hx of asthma, or reactive airway disease, he did use an albuterol inhaler prior to arrival and it did help him. Pt is otherwise stable at this time

## 2023-12-15 ENCOUNTER — Other Ambulatory Visit: Payer: Self-pay

## 2023-12-15 ENCOUNTER — Emergency Department (HOSPITAL_COMMUNITY)
Admission: EM | Admit: 2023-12-15 | Discharge: 2023-12-15 | Disposition: A | Discharge status: Home / Self Care | Source: Home / Self Care | Attending: Emergency Medicine | Admitting: Emergency Medicine

## 2023-12-15 ENCOUNTER — Encounter (HOSPITAL_COMMUNITY): Payer: Self-pay

## 2023-12-15 DIAGNOSIS — R062 Wheezing: Secondary | ICD-10-CM | POA: Insufficient documentation

## 2023-12-15 DIAGNOSIS — R0602 Shortness of breath: Secondary | ICD-10-CM | POA: Insufficient documentation

## 2023-12-15 DIAGNOSIS — R030 Elevated blood-pressure reading, without diagnosis of hypertension: Secondary | ICD-10-CM

## 2023-12-15 MED ORDER — ALBUTEROL SULFATE HFA 108 (90 BASE) MCG/ACT IN AERS
1.0000 | INHALATION_SPRAY | Freq: Once | RESPIRATORY_TRACT | Status: DC
  Filled 2023-12-15: qty 6.7

## 2023-12-15 MED ORDER — ALBUTEROL SULFATE (2.5 MG/3ML) 0.083% IN NEBU
2.5000 mg | INHALATION_SOLUTION | Freq: Once | RESPIRATORY_TRACT | Status: AC
  Administered 2023-12-15: 2.5 mg via RESPIRATORY_TRACT
  Filled 2023-12-15: qty 3

## 2023-12-15 MED ORDER — ALBUTEROL SULFATE HFA 108 (90 BASE) MCG/ACT IN AERS: 1.0000 | INHALATION_SPRAY | Freq: Four times a day (QID) | RESPIRATORY_TRACT | 2 refills | Status: AC | PRN

## 2023-12-15 NOTE — Discharge Instructions (Addendum)
 Today you were seen for shortness of breath.  Please pick up your albuterol inhaler and take 1 to 2 puffs every 6 hours as needed.  You also had elevated blood pressure readings while in the ED, please establish care with a primary care physician to be evaluated and possibly treated for hypertension.  Thank you for letting us treat you today. After reviewing your labs and imaging, I feel you are safe to go home. Please follow up with your PCP in the next several days and provide them with your records from this visit. Return to the Emergency Room if pain becomes severe or symptoms worsen.

## 2023-12-15 NOTE — ED Provider Notes (Signed)
  EMERGENCY DEPARTMENT AT [facility] Provider Note   CSN: [id number] Arrival date & time: 12/15/23  7829     History  Chief Complaint  Patient presents with   Shortness of Breath    Jeremy Pham is a 42 y.o. male presents today for open presents today for shortness of breath.  Patient checked in yesterday for same but left due to wait time.  Patient denies any medical history but does endorse that his son and his mother both have asthma.  Patient states that he also has a nonproductive cough intermittently and is awoken from his sleep due to shortness of breath and coughing.  Patient denies fever, chills, congestion, nausea, vomiting, or any other complaints at this time.  Patient was given albuterol in triage yesterday and stated that this helped with his symptoms.   Shortness of Breath      Home Medications Prior to Admission medications   Medication Sig Start Date End Date Taking? Authorizing Provider  albuterol (VENTOLIN HFA) 108 (90 Base) MCG/ACT inhaler Inhale 1-2 puffs into the lungs every 6 (six) hours as needed for wheezing or shortness of breath. 12/15/23  Yes Dolphus Jenny, PA-C  doxycycline (VIBRAMYCIN) 100 MG capsule Take 1 capsule (100 mg total) by mouth 2 (two) times daily. 02/01/14   Junious Silk, PA-C  oxyCODONE-acetaminophen (PERCOCET/ROXICET) 5-325 MG per tablet Take 1-2 tablets by mouth every 6 (six) hours as needed for severe pain. 02/01/14   Junious Silk, PA-C  promethazine (PHENERGAN) 25 MG tablet Take 1 tablet (25 mg total) by mouth every 6 (six) hours as needed for nausea or vomiting. 02/01/14   Junious Silk, PA-C      Allergies    Penicillins    Review of Systems   Review of Systems  Respiratory:  Positive for shortness of breath.     Physical Exam Updated Vital Signs BP (!) 150/114   Pulse 79   Temp (!) 97.4 F (36.3 C) (Oral)   Resp 16   Ht 5\' 7"  (1.702 m)   Wt 70.3 kg   SpO2 95%   BMI 24.28 kg/m  Physical  Exam Vitals and nursing note reviewed.  Constitutional:      General: He is not in acute distress.    Appearance: He is well-developed and normal weight. He is not ill-appearing, toxic-appearing or diaphoretic.  HENT:     Head: Normocephalic and atraumatic.     Mouth/Throat:     Mouth: Mucous membranes are moist.  Eyes:     Extraocular Movements: Extraocular movements intact.     Conjunctiva/sclera: Conjunctivae normal.  Cardiovascular:     Rate and Rhythm: Normal rate and regular rhythm.     Pulses: Normal pulses.     Heart sounds: Normal heart sounds. No murmur heard. Pulmonary:     Effort: Pulmonary effort is normal. Tachypnea present. No respiratory distress.     Breath sounds: Examination of the right-lower field reveals wheezing. Examination of the left-lower field reveals wheezing. Wheezing present.     Comments: Mild tachypnea on exam Chest:     Chest wall: No tenderness.  Abdominal:     Palpations: Abdomen is soft.     Tenderness: There is no abdominal tenderness.  Musculoskeletal:        General: No swelling.     Cervical back: Neck supple.     Right lower leg: No edema.     Left lower leg: No edema.  Skin:    General:  Skin is warm and dry.     Capillary Refill: Capillary refill takes less than 2 seconds.  Neurological:     General: No focal deficit present.     Mental Status: He is alert.  Psychiatric:        Mood and Affect: Mood normal.     ED Results / Procedures / Treatments   Labs (all labs ordered are listed, but only abnormal results are displayed) Labs Reviewed - No data to display  EKG None  Radiology DG Chest 2 View Result Date: 12/14/2023 CLINICAL DATA:  Chest pain EXAM: CHEST - 2 VIEW COMPARISON:  09/17/2022 FINDINGS: The heart size and mediastinal contours are within normal limits. Both lungs are clear. The visualized skeletal structures are unremarkable. IMPRESSION: No active cardiopulmonary disease. Electronically Signed   By: Jasmine Pang  M.D.   On: 12/14/2023 23:04    Procedures Procedures    Medications Ordered in ED Medications  albuterol (VENTOLIN HFA) 108 (90 Base) MCG/ACT inhaler 1 puff (has no administration in time range)  albuterol (PROVENTIL) (2.5 MG/3ML) 0.083% nebulizer solution 2.5 mg (2.5 mg Nebulization Given 12/15/23 0902)    ED Course/ Medical Decision Making/ A&P                                 Medical Decision Making Risk Prescription drug management.   This patient presents to the ED with chief complaint(s) of shortness of breath with pertinent past medical history of none which further complicates the presenting complaint. The complaint involves an extensive differential diagnosis and also carries with it a high risk of complications and morbidity.    The differential diagnosis includes asthma, COPD, pneumonia, bronchitis, acute cough  Additional history obtained: Records reviewed Care Everywhere/External Records  ED Course and Reassessment: Albuterol nebulizer ordered Upon reassessment patient is no longer wheezing in lower lobes.  Patient states that he feels like he is breathing much better.  Consultation: - Consulted or discussed management/test interpretation w/ external professional: None  Consideration for admission or further workup: Considered for admission or further evaluations vital signs and physical exam are reassuring.  Patient's labs and imaging from last night were reassuring, I do not feel that repeating these labs at this time would be beneficial or change the patient's disposition.  Patient has been prescribed albuterol inhaler for shortness of breath, and advised to follow-up with primary care for further evaluation of the shortness of breath as well as elevated blood pressure readings while in ED.        Final Clinical Impression(s) / ED Diagnoses Final diagnoses:  Wheezing  Elevated blood pressure reading    Rx / DC Orders ED Discharge Orders           Ordered    albuterol (VENTOLIN HFA) 108 (90 Base) MCG/ACT inhaler  Every 6 hours PRN        12/15/23 0924              Dolphus Jenny, PA-C 12/15/23 [id number]    Benjiman Core, MD 12/16/23 [phone]

## 2023-12-15 NOTE — ED Triage Notes (Signed)
 Pt states he has been having sob, chest pain, and cough for a while. Pt came today d/t being woken out his sleep.  Pt was seen yesterday for same concerns but left d/t wait time.

## 2023-12-15 NOTE — ED Notes (Signed)
 X2 for room assignment/registration with no response and not visible in the lobby area
